# Patient Record
Sex: Female | Born: 1955 | Race: White | Hispanic: No | Marital: Married
Health system: Midwestern US, Academic
[De-identification: ages and names within clinical notes are randomized; demographics above are authoritative.]

---

## 2017-09-16 ENCOUNTER — Encounter: Admit: 2017-09-16 | Discharge: 2017-09-16 | Payer: MEDICARE

## 2017-09-16 ENCOUNTER — Encounter: Admit: 2017-09-16 | Discharge: 2017-09-17 | Payer: MEDICARE

## 2017-09-16 DIAGNOSIS — I493 Ventricular premature depolarization: Principal | ICD-10-CM

## 2017-10-17 ENCOUNTER — Encounter: Admit: 2017-10-17 | Discharge: 2017-10-17 | Payer: MEDICARE

## 2017-10-17 ENCOUNTER — Ambulatory Visit: Admit: 2017-10-17 | Discharge: 2017-10-18 | Payer: MEDICARE

## 2017-10-17 DIAGNOSIS — R002 Palpitations: Secondary | ICD-10-CM

## 2017-10-18 DIAGNOSIS — I272 Pulmonary hypertension, unspecified: ICD-10-CM

## 2017-10-18 DIAGNOSIS — M320 Drug-induced systemic lupus erythematosus: ICD-10-CM

## 2017-10-18 DIAGNOSIS — Z9989 Dependence on other enabling machines and devices: ICD-10-CM

## 2017-10-18 DIAGNOSIS — I493 Ventricular premature depolarization: Principal | ICD-10-CM

## 2017-10-18 DIAGNOSIS — Z9889 Other specified postprocedural states: ICD-10-CM

## 2017-10-18 DIAGNOSIS — G4733 Obstructive sleep apnea (adult) (pediatric): ICD-10-CM

## 2017-10-18 DIAGNOSIS — I10 Essential (primary) hypertension: ICD-10-CM

## 2018-03-12 ENCOUNTER — Encounter: Admit: 2018-03-12 | Discharge: 2018-03-13 | Payer: MEDICARE

## 2018-04-11 ENCOUNTER — Encounter: Admit: 2018-04-11 | Discharge: 2018-04-11 | Payer: MEDICARE

## 2018-04-11 DIAGNOSIS — M329 Systemic lupus erythematosus, unspecified: ICD-10-CM

## 2018-04-11 DIAGNOSIS — I272 Pulmonary hypertension, unspecified: Principal | ICD-10-CM

## 2018-05-23 ENCOUNTER — Ambulatory Visit: Admit: 2018-05-23 | Discharge: 2018-05-23 | Payer: MEDICARE

## 2018-05-23 DIAGNOSIS — M329 Systemic lupus erythematosus, unspecified: Secondary | ICD-10-CM

## 2018-05-23 DIAGNOSIS — I272 Pulmonary hypertension, unspecified: Secondary | ICD-10-CM

## 2018-05-26 ENCOUNTER — Encounter: Admit: 2018-05-26 | Discharge: 2018-05-26 | Payer: MEDICARE

## 2018-05-26 ENCOUNTER — Ambulatory Visit: Admit: 2018-05-26 | Discharge: 2018-05-26 | Payer: MEDICARE

## 2018-05-26 DIAGNOSIS — M4300 Spondylolysis, site unspecified: Secondary | ICD-10-CM

## 2018-05-26 DIAGNOSIS — M329 Systemic lupus erythematosus, unspecified: ICD-10-CM

## 2018-05-26 DIAGNOSIS — E039 Hypothyroidism, unspecified: Secondary | ICD-10-CM

## 2018-05-26 DIAGNOSIS — M797 Fibromyalgia: Secondary | ICD-10-CM

## 2018-05-26 DIAGNOSIS — I420 Dilated cardiomyopathy: Secondary | ICD-10-CM

## 2018-05-26 DIAGNOSIS — I10 Essential (primary) hypertension: ICD-10-CM

## 2018-05-26 DIAGNOSIS — I270 Primary pulmonary hypertension: Secondary | ICD-10-CM

## 2018-05-26 DIAGNOSIS — K219 Gastro-esophageal reflux disease without esophagitis: Secondary | ICD-10-CM

## 2018-05-26 DIAGNOSIS — I272 Pulmonary hypertension, unspecified: Secondary | ICD-10-CM

## 2018-05-26 LAB — CBC AND DIFF
Lab: 0 10*3/uL (ref 0–0.20)
Lab: 10 % (ref 4–12)
Lab: 13 g/dL (ref 12.0–15.0)
Lab: 2.5 K/UL (ref 1.8–7.0)
Lab: 4.3 M/UL (ref 4.0–5.0)
Lab: 4.6 10*3/uL (ref 4.5–11.0)

## 2018-05-26 LAB — COMPREHENSIVE METABOLIC PANEL
Lab: 0.4 mg/dL (ref 0.3–1.2)
Lab: 0.8 mg/dL (ref 0.4–1.00)
Lab: 106 MMOL/L (ref 98–110)
Lab: 110 mg/dL — ABNORMAL HIGH (ref 70–100)
Lab: 15 U/L (ref 7–40)
Lab: 19 mg/dL (ref 7–25)
Lab: 28 MMOL/L (ref 21–30)
Lab: 3.9 MMOL/L (ref 3.5–5.1)
Lab: 4.2 g/dL (ref 3.5–5.0)
Lab: 6.3 g/dL (ref 6.0–8.0)
Lab: 8 K/UL (ref 3–12)
Lab: 9 mg/dL (ref 8.5–10.6)
Lab: >60 mL/min (ref >60)
Lab: >60 mL/min (ref >60)

## 2018-05-26 LAB — BNP (B-TYPE NATRIURETIC PEPTI): Lab: 115 pg/mL — ABNORMAL HIGH (ref 0–100)

## 2018-05-27 ENCOUNTER — Encounter: Admit: 2018-05-27 | Discharge: 2018-05-27 | Payer: MEDICARE

## 2018-05-30 ENCOUNTER — Encounter: Admit: 2018-05-30 | Discharge: 2018-05-30 | Payer: MEDICARE

## 2018-05-30 DIAGNOSIS — I272 Pulmonary hypertension, unspecified: Secondary | ICD-10-CM

## 2018-06-04 ENCOUNTER — Encounter: Admit: 2018-06-04 | Discharge: 2018-06-04 | Payer: MEDICARE

## 2018-06-05 ENCOUNTER — Ambulatory Visit: Admit: 2018-06-05 | Discharge: 2018-06-05 | Payer: MEDICARE

## 2018-06-05 ENCOUNTER — Encounter: Admit: 2018-06-05 | Discharge: 2018-06-05 | Payer: MEDICARE

## 2018-06-05 DIAGNOSIS — I272 Pulmonary hypertension, unspecified: Secondary | ICD-10-CM

## 2018-06-05 DIAGNOSIS — E039 Hypothyroidism, unspecified: Principal | ICD-10-CM

## 2018-06-05 DIAGNOSIS — M797 Fibromyalgia: ICD-10-CM

## 2018-06-05 DIAGNOSIS — K219 Gastro-esophageal reflux disease without esophagitis: ICD-10-CM

## 2018-06-05 DIAGNOSIS — M329 Systemic lupus erythematosus, unspecified: ICD-10-CM

## 2018-06-05 DIAGNOSIS — G4733 Obstructive sleep apnea (adult) (pediatric): Secondary | ICD-10-CM

## 2018-06-05 DIAGNOSIS — M4300 Spondylolysis, site unspecified: ICD-10-CM

## 2018-06-05 DIAGNOSIS — I10 Essential (primary) hypertension: Secondary | ICD-10-CM

## 2018-06-05 LAB — O2HGB SAT-VENOUS POC
Lab: 73 % — ABNORMAL HIGH (ref 55–71)
Lab: 73 % — ABNORMAL HIGH (ref 55–71)

## 2018-06-05 MED ORDER — DIPHENHYDRAMINE HCL 25 MG PO CAP
25 mg | ORAL | 0 refills | Status: DC | PRN
Start: 2018-06-05 — End: 2018-06-06

## 2018-06-05 MED ORDER — ALUMINUM-MAGNESIUM HYDROXIDE 200-200 MG/5 ML PO SUSP
30 mL | ORAL | 0 refills | Status: DC | PRN
Start: 2018-06-05 — End: 2018-06-06

## 2018-06-05 MED ORDER — SODIUM CHLORIDE 0.9 % IV SOLP
1000 mL | INTRAVENOUS | 0 refills | Status: DC
Start: 2018-06-05 — End: 2018-06-06
  Administered 2018-06-05: 17:00:00 1000 mL via INTRAVENOUS

## 2018-06-05 MED ORDER — DIPHENHYDRAMINE HCL 50 MG/ML IJ SOLN
25 mg | INTRAVENOUS | 0 refills | Status: DC | PRN
Start: 2018-06-05 — End: 2018-06-06

## 2018-06-05 MED ORDER — ACETAMINOPHEN 325 MG PO TAB
650 mg | ORAL | 0 refills | Status: DC | PRN
Start: 2018-06-05 — End: 2018-06-06

## 2018-06-05 MED ORDER — ONDANSETRON HCL (PF) 4 MG/2 ML IJ SOLN
4 mg | INTRAVENOUS | 0 refills | Status: DC | PRN
Start: 2018-06-05 — End: 2018-06-06

## 2018-06-06 ENCOUNTER — Encounter: Admit: 2018-06-06 | Discharge: 2018-06-06 | Payer: MEDICARE

## 2018-06-06 NOTE — Telephone Encounter
Reviewed [facility] report with Dr. Clinton Sawyer and advised Okey Regal of normal results.  Can follow-up in clinic as needed.  Informed Zeffie that Dr. Clinton Sawyer Delarosa clear her for knee surgery that is scheduled for 2/18 per Dr. Sherilyn Banker - [facility] fax clearance note when available, likely early next week.

## 2018-06-12 ENCOUNTER — Encounter: Admit: 2018-06-12 | Discharge: 2018-06-12 | Payer: MEDICARE

## 2018-06-19 ENCOUNTER — Encounter: Admit: 2018-06-19 | Discharge: 2018-06-19 | Payer: MEDICARE

## 2018-06-19 NOTE — Progress Notes
Faxed pulmonary records and clearance note to Dr. Lin Landsman office at [phone]

## 2018-06-26 ENCOUNTER — Encounter: Admit: 2018-06-26 | Discharge: 2018-06-26 | Payer: MEDICARE

## 2018-07-07 ENCOUNTER — Encounter: Admit: 2018-07-07 | Discharge: 2018-07-07 | Payer: MEDICARE
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
## 2023-04-13 ENCOUNTER — Encounter: Admit: 2023-04-13 | Discharge: 2023-04-13 | Payer: MEDICARE

## 2023-08-13 ENCOUNTER — Encounter: Admit: 2023-08-13 | Discharge: 2023-08-13 | Payer: MEDICARE
